# Patient Record
Sex: Female | Born: 2000 | Race: White | Hispanic: Yes | Marital: Single | Smoking: Never smoker
Health system: Southern US, Community
[De-identification: ages and names within clinical notes are randomized; demographics above are authoritative.]

## PROBLEM LIST (undated history)

## (undated) DIAGNOSIS — R011 Cardiac murmur, unspecified: Secondary | ICD-10-CM

## (undated) DIAGNOSIS — R7303 Prediabetes: Secondary | ICD-10-CM

## (undated) DIAGNOSIS — G43109 Migraine with aura, not intractable, without status migrainosus: Secondary | ICD-10-CM

## (undated) HISTORY — PX: MOUTH SURGERY: SHX715

## (undated) HISTORY — DX: Migraine with aura, not intractable, without status migrainosus: G43.109

---
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
## 2019-06-10 NOTE — Progress Notes (Signed)
Kim Gauss, MD   Chief Complaint  Patient presents with  . Metrorrhagia    3 periods in Nov, last one 11/18 and still on it, left side pelvic pain felt this morning, stopped BC in April    HPI:      Kim Trevino is a 18 y.o. G0P0000 who LMP was Patient's last menstrual period was 05/22/2019 (exact date)., presents today for irregular bleeding since 11/20. Menses are usually monthly, last 7-10 days, no BTB, mild dysmen, improved with NSAIDs. Started slynd for [facility] 2/20 and took for 2 months but stopped due to depression and wt gain sx. Feels better off POPs. Hx of migraines with aura. Menses resumed to monthly and had normal menses 10/20. Then had mid cycle spotting 11/20 and normal menses starting 05/22/19, but it hasn't stopped since. Flow is tapering down. Noted sharp, intermittent LLQ pain this AM. Pt denies vag and urin sx, no LBP, fevers.  She is sex active, using condoms every time. No UPT done.  No hx of ovar cyst. FH ovar cancer.   There are no active problems to display for this patient.   History reviewed. No pertinent surgical history.  Family History  Problem Relation Age of Onset  . Ovarian cancer Maternal Grandmother 88       tested, benign    Social History   Socioeconomic History  . Marital status: Single    Spouse name: Not on file  . Number of children: Not on file  . Years of education: Not on file  . Highest education level: Not on file  Occupational History  . Not on file  Social Needs  . Financial resource strain: Not on file  . Food insecurity    Worry: Not on file    Inability: Not on file  . Transportation needs    Medical: Not on file    Non-medical: Not on file  Tobacco Use  . Smoking status: Never Smoker  . Smokeless tobacco: Never Used  Substance and Sexual Activity  . Alcohol use: No  . Drug use: Never  . Sexual activity: Yes    Birth control/protection: None  Lifestyle  . Physical activity    Days per week: Not on  file    Minutes per session: Not on file  . Stress: Not on file  Relationships  . Social Herbalist on phone: Not on file    Gets together: Not on file    Attends religious service: Not on file    Active member of club or organization: Not on file    Attends meetings of clubs or organizations: Not on file    Relationship status: Not on file  . Intimate partner violence    Fear of current or ex partner: Not on file    Emotionally abused: Not on file    Physically abused: Not on file    Forced sexual activity: Not on file  Other Topics Concern  . Not on file  Social History Narrative  . Not on file    Outpatient Medications Prior to Visit  Medication Sig Dispense Refill  . Drospirenone (SLYND) 4 MG TABS Take 1 tablet by mouth daily. (Patient not taking: Reported on 06/11/2019) 84 tablet 3   No facility-administered medications prior to visit.       ROS:  Review of Systems  Constitutional: Negative for fatigue, fever and unexpected weight change.  Respiratory: Negative for cough, shortness of breath and  wheezing.   Cardiovascular: Negative for chest pain, palpitations and leg swelling.  Gastrointestinal: Negative for blood in stool, constipation, diarrhea, nausea and vomiting.  Endocrine: Negative for cold intolerance, heat intolerance and polyuria.  Genitourinary: Positive for dyspareunia and menstrual problem. Negative for dysuria, flank pain, frequency, genital sores, hematuria, pelvic pain, urgency, vaginal bleeding, vaginal discharge and vaginal pain.  Musculoskeletal: Negative for back pain, joint swelling and myalgias.  Skin: Negative for rash.  Neurological: Negative for dizziness, syncope, light-headedness, numbness and headaches.  Hematological: Negative for adenopathy.  Psychiatric/Behavioral: Negative for agitation, confusion, sleep disturbance and suicidal ideas. The patient is not nervous/anxious.    BREAST: No symptoms   OBJECTIVE:   Vitals:   BP 100/78   Ht 5\' 3"  (1.6 m)   Wt 123 lb (55.8 kg)   LMP 05/22/2019 (Exact Date)   BMI 21.79 kg/m   Physical Exam Vitals signs reviewed.  Constitutional:      Appearance: She is well-developed.  Neck:     Musculoskeletal: Normal range of motion.  Pulmonary:     Effort: Pulmonary effort is normal.  Genitourinary:    General: Normal vulva.     Pubic Area: No rash.      Labia:        Right: No rash, tenderness or lesion.        Left: No rash, tenderness or lesion.      Vagina: Normal. No vaginal discharge, erythema, tenderness or bleeding.     Cervix: Normal.     Uterus: Normal. Not enlarged and not tender.      Adnexa: Right adnexa normal.       Right: No mass or tenderness.         Left: Tenderness present. No mass.    Musculoskeletal: Normal range of motion.  Skin:    General: Skin is warm and dry.  Neurological:     General: No focal deficit present.     Mental Status: She is alert and oriented to person, place, and time.  Psychiatric:        Mood and Affect: Mood normal.        Behavior: Behavior normal.        Thought Content: Thought content normal.        Judgment: Judgment normal.     Results: Results for orders placed or performed in visit on 06/11/19 (from the past 24 hour(s))  POCT urine pregnancy     Status: Normal   Collection Time: 06/11/19 10:57 AM  Result Value Ref Range   Preg Test, Ur Negative Negative     Assessment/Plan: Abnormal uterine bleeding (AUB) - Plan: CH STD, POCT urine pregnancy; For 1 cylce. Neg UPT. Check STDs. If neg, see if sx resolve. Most likely due to stress. If bleeding persists (tapering off), will try prog. If AUB continues next month, will check labs and GYN u/s. F/u prn.   Screening for STD (sexually transmitted disease) - Plan: CH STD  LLQ pain--for 1 day, slightly tender on exam. Check STD. If neg and sx persist, will check GYN u/s.      Return if symptoms worsen or fail to improve.  Alicia B. Copland, PA-C  06/11/2019 11:09 AM

## 2019-06-11 ENCOUNTER — Ambulatory Visit (INDEPENDENT_AMBULATORY_CARE_PROVIDER_SITE_OTHER): Payer: Medicaid Other | Admitting: Obstetrics and Gynecology

## 2019-06-11 ENCOUNTER — Other Ambulatory Visit: Payer: Self-pay

## 2019-06-11 ENCOUNTER — Other Ambulatory Visit (HOSPITAL_COMMUNITY)
Admission: RE | Admit: 2019-06-11 | Discharge: 2019-06-11 | Disposition: A | Discharge status: Home / Self Care | Payer: Medicaid Other | Source: Ambulatory Visit | Attending: Obstetrics and Gynecology | Admitting: Obstetrics and Gynecology

## 2019-06-11 ENCOUNTER — Encounter: Payer: Self-pay | Admitting: Obstetrics and Gynecology

## 2019-06-11 VITALS — BP 100/78 | Ht 63.0 in | Wt 123.0 lb

## 2019-06-11 DIAGNOSIS — N926 Irregular menstruation, unspecified: Secondary | ICD-10-CM | POA: Insufficient documentation

## 2019-06-11 DIAGNOSIS — Z3202 Encounter for pregnancy test, result negative: Secondary | ICD-10-CM

## 2019-06-11 DIAGNOSIS — R1032 Left lower quadrant pain: Secondary | ICD-10-CM | POA: Diagnosis not present

## 2019-06-11 DIAGNOSIS — Z113 Encounter for screening for infections with a predominantly sexual mode of transmission: Secondary | ICD-10-CM | POA: Insufficient documentation

## 2019-06-11 DIAGNOSIS — N939 Abnormal uterine and vaginal bleeding, unspecified: Secondary | ICD-10-CM | POA: Diagnosis not present

## 2019-06-11 LAB — POCT URINE PREGNANCY: Preg Test, Ur: NEGATIVE

## 2019-06-11 NOTE — Patient Instructions (Signed)
I value your feedback and entrusting us with your care. If you get a [facility] patient survey, I would appreciate you taking the time to let us know about your experience today. Thank you! 

## 2019-06-12 ENCOUNTER — Encounter: Payer: Self-pay | Admitting: Obstetrics and Gynecology

## 2019-06-12 LAB — CERVICOVAGINAL ANCILLARY ONLY
Chlamydia: NEGATIVE
Comment: NEGATIVE
Comment: NORMAL
Neisseria Gonorrhea: NEGATIVE

## 2019-07-28 IMAGING — CT CT ABD-PELV W/ CM
2 of 4 series · 16 of 46 positions shown, 18 images · IV contrast (APPLIED)
Comparison: None.

CLINICAL DATA: Right flank pain

EXAM:
CT ABDOMEN AND PELVIS WITH CONTRAST
TECHNIQUE: Multidetector CT imaging of the abdomen and pelvis was performed
using the standard protocol following bolus administration of
intravenous contrast.
CONTRAST:  75mL OMNIPAQUE IOHEXOL 300 MG/ML  SOLN

[Series 2: routine abd/pel with · axial · 0.66mm/px · z∈[-462,-57]mm · 13 of 89 slices shown, 15 images]
[im 4/89  soft-tissue]
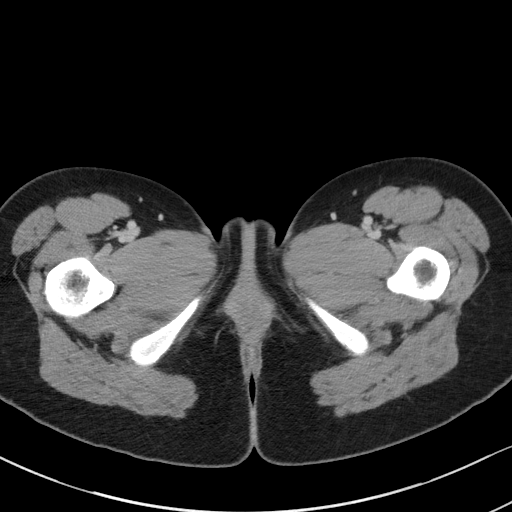
[im 4/89  bone]
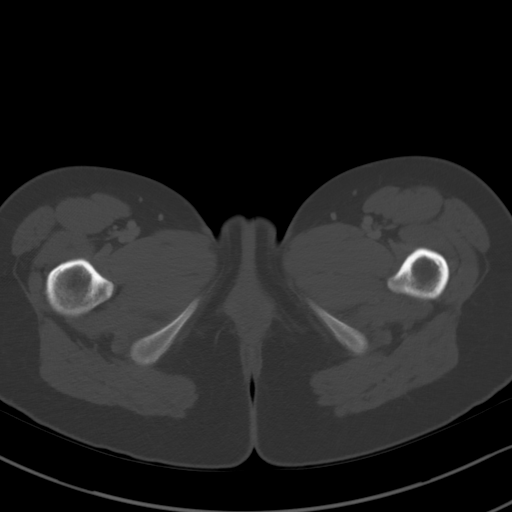
[im 11/89  soft-tissue]
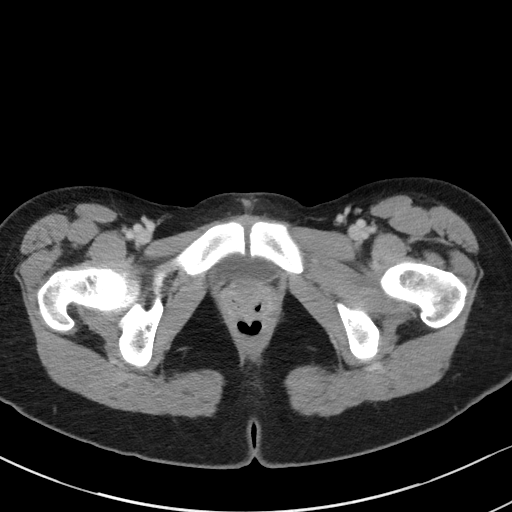
[im 18/89  soft-tissue]
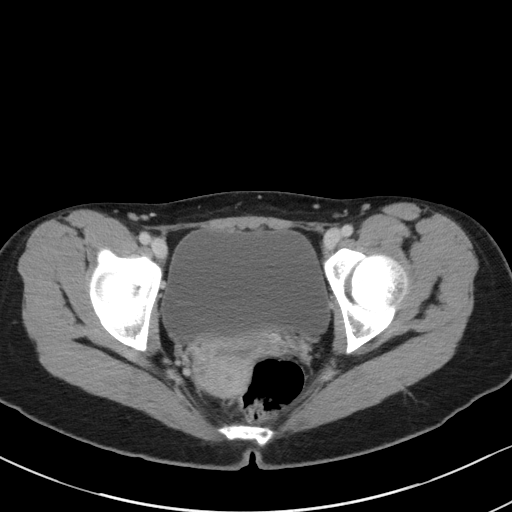
[im 25/89  soft-tissue]
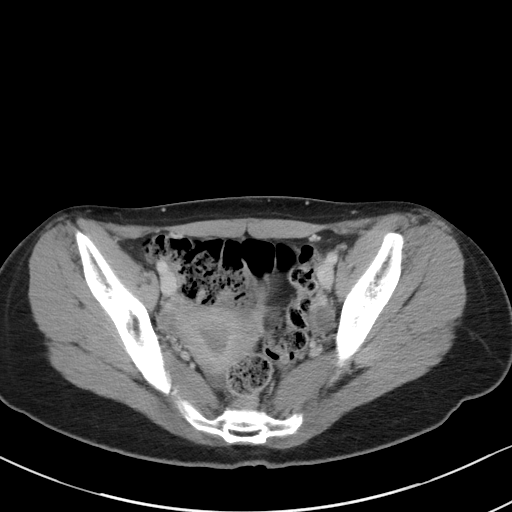
[im 32/89  soft-tissue]
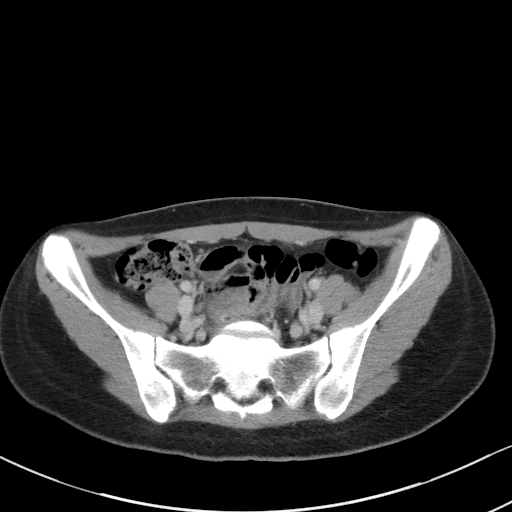
[im 39/89  soft-tissue]
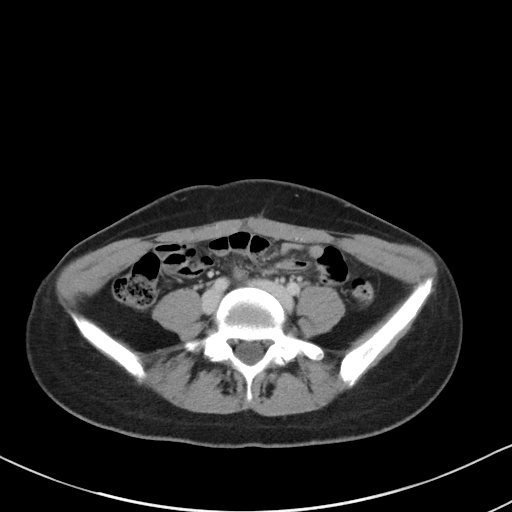
[im 46/89  soft-tissue]
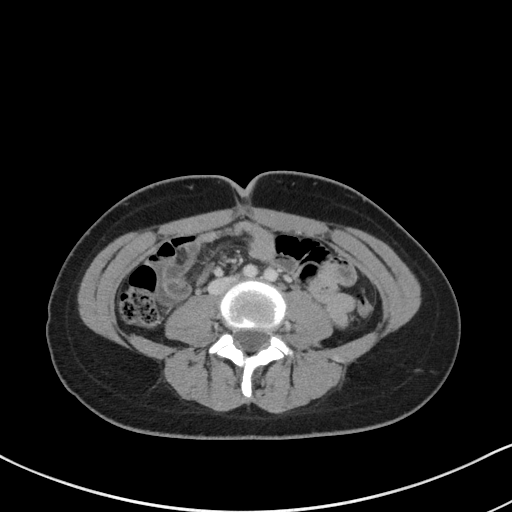
[im 50/89  soft-tissue]
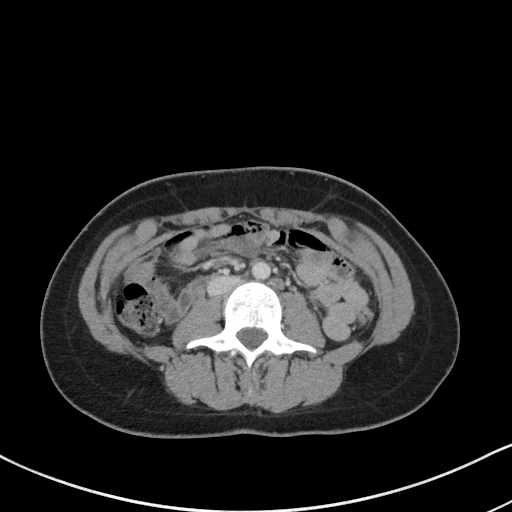
[im 57/89  soft-tissue]
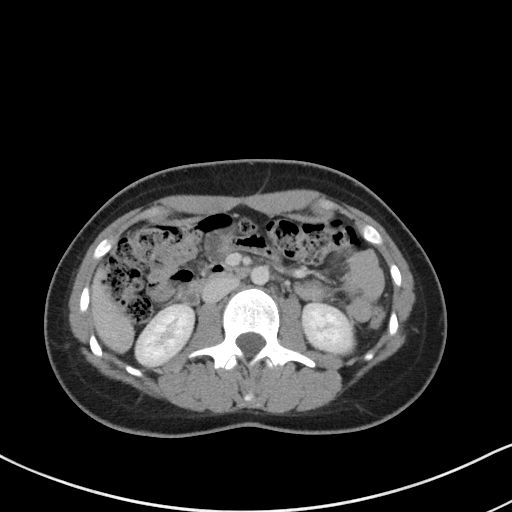
[im 57/89  bone]
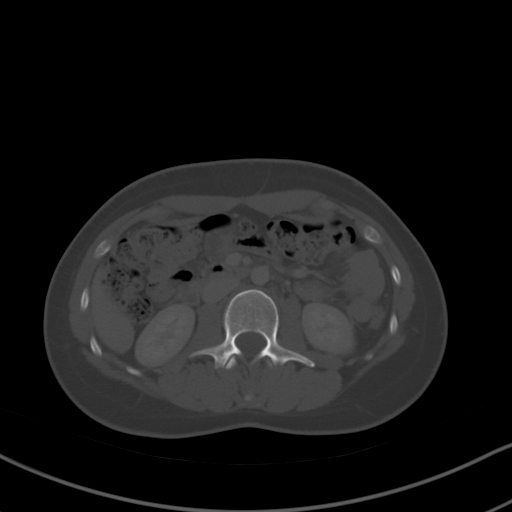
[im 64/89  soft-tissue]
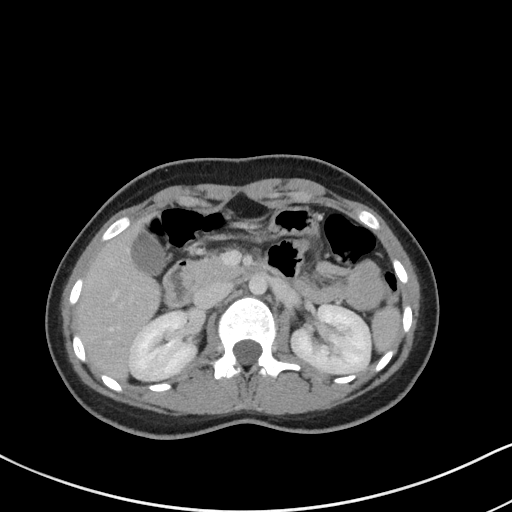
[im 71/89  soft-tissue]
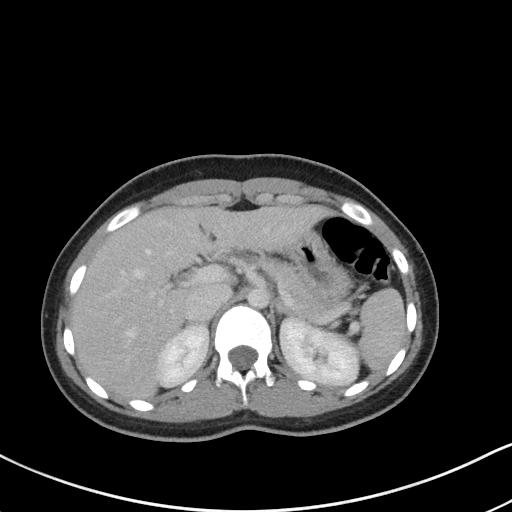
[im 78/89  soft-tissue]
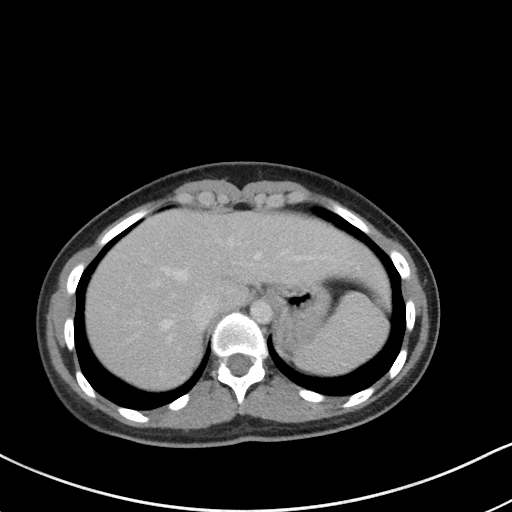
[im 85/89  soft-tissue]
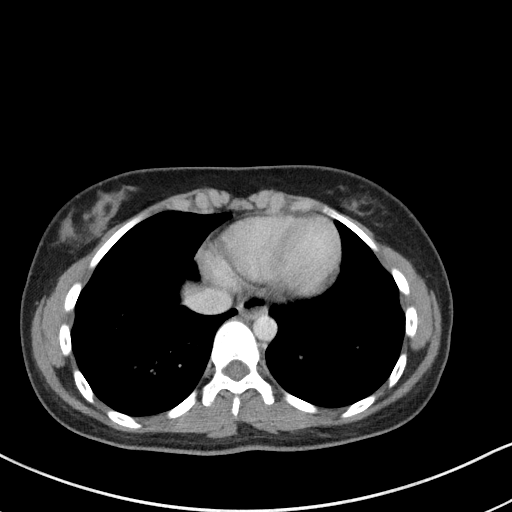

[Series 5: coronal st · coronal · 0.62mm/px · 3 of 69 slices shown]
[im 23/69  soft-tissue]
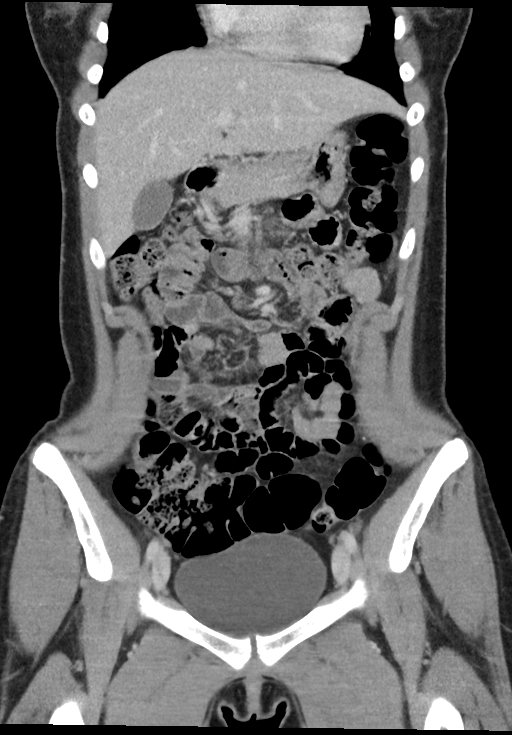
[im 31/69  soft-tissue]
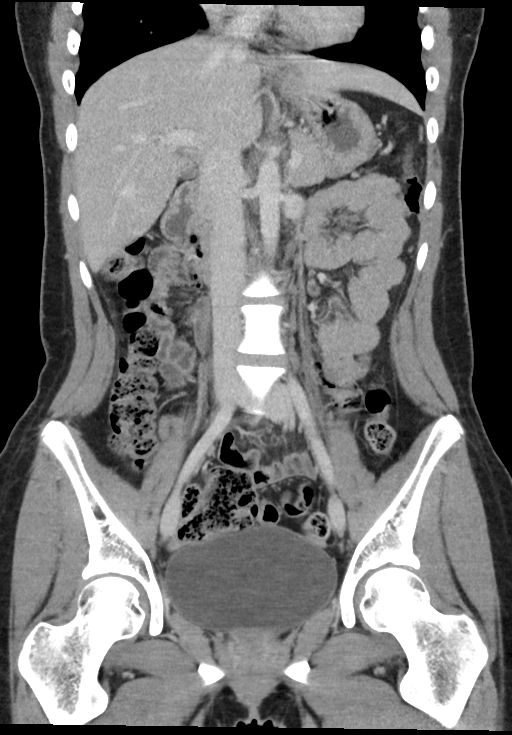
[im 38/69  soft-tissue]
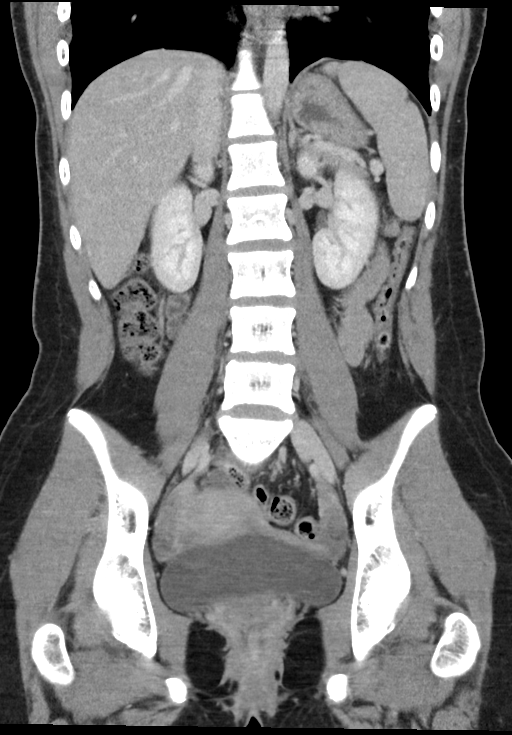

[16 of 46 positions shown; findings below may reference images not displayed]

FINDINGS: Lower chest: No acute abnormality.

Hepatobiliary: No focal liver abnormality is seen. No gallstones,
gallbladder wall thickening, or biliary dilatation.

Pancreas: Unremarkable. No pancreatic ductal dilatation or
surrounding inflammatory changes.

Spleen: Normal in size without focal abnormality.

Adrenals/Urinary Tract: Adrenal glands are unremarkable. Kidneys are
normal, without renal calculi, focal lesion, or hydronephrosis.
Bladder is unremarkable.

Stomach/Bowel: There is a small hiatal hernia. The stomach is
otherwise normal. The appendix is not seen but no inflammation is
noted around cecum. No evidence of bowel wall thickening,
distention, or inflammatory changes.

Vascular/Lymphatic: No significant vascular findings are present. No
enlarged abdominal or pelvic lymph nodes.

Reproductive: Uterus and bilateral adnexa are unremarkable.

Other: Minimal free fluid is identified in the pelvis, likely
physiologic.

Musculoskeletal: No acute or significant osseous findings.
IMPRESSION: No acute abnormality identified in the abdomen pelvis. There is no
bowel obstruction.

The appendix is not seen but no inflammation is noted around the
cecum.

Bilateral kidneys are normal.

## 2020-09-01 IMAGING — US US ABDOMEN LIMITED
1 series · 14 of 25 positions shown · non-contrast
Comparison: None.

CLINICAL DATA: Right-sided abdominal pain.

EXAM:
ULTRASOUND ABDOMEN LIMITED
TECHNIQUE: Gray scale imaging of the right lower quadrant was performed to
evaluate for suspected appendicitis. Standard imaging planes and
graded compression technique were utilized.

[Series 1: us abdomen limited · 0.13mm/px · 14 of 29 slices shown]
[im 1/29]
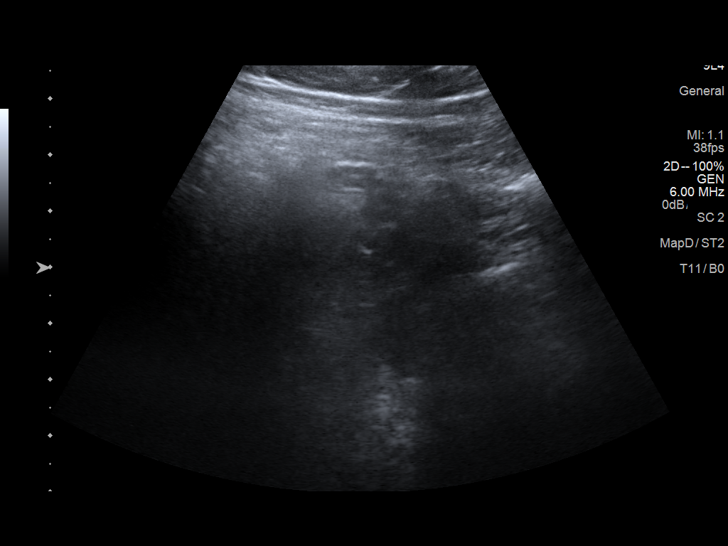
[im 3/29]
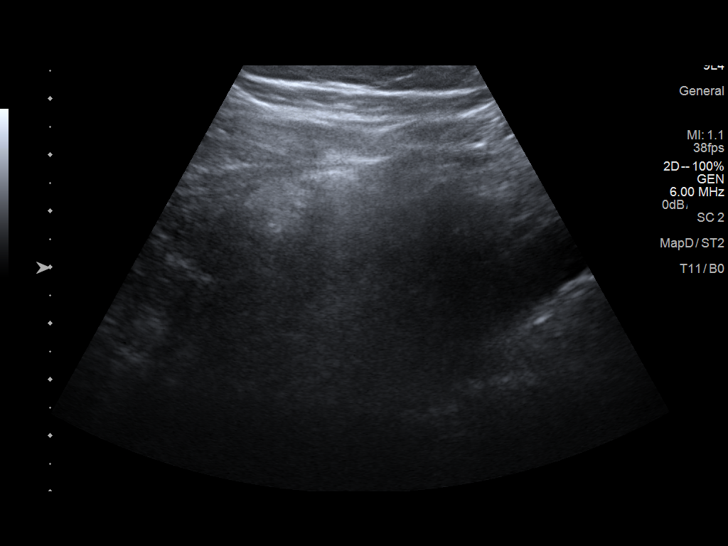
[im 5/29]
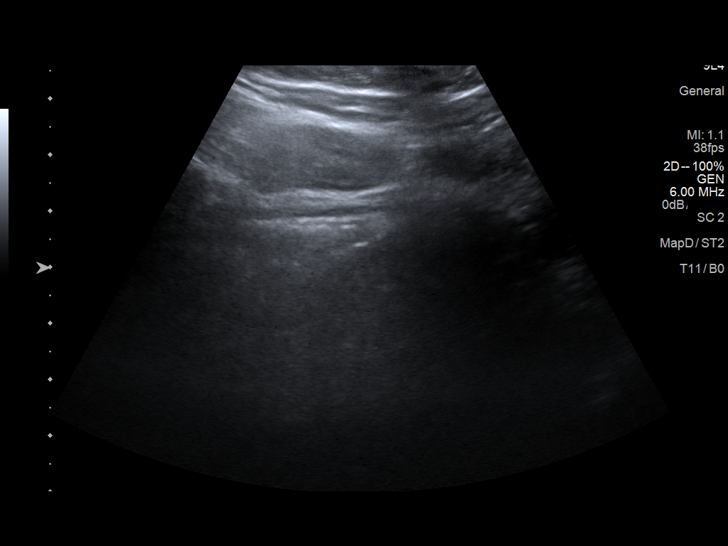
[im 8/29]
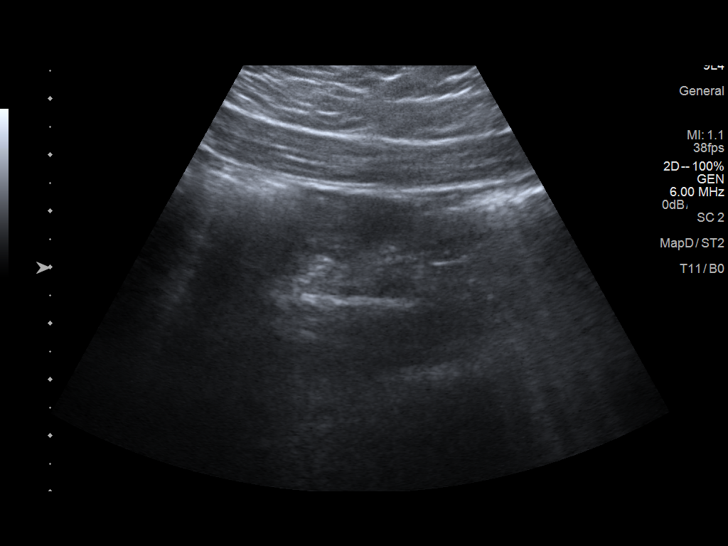
[im 10/29]
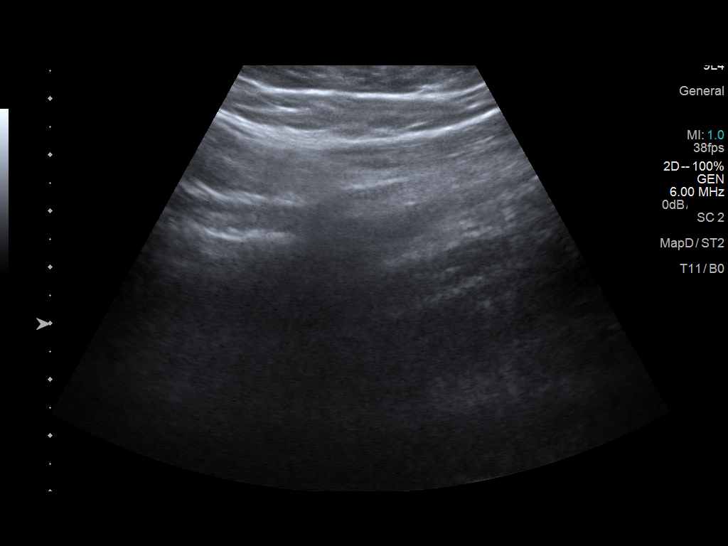
[im 11/29]
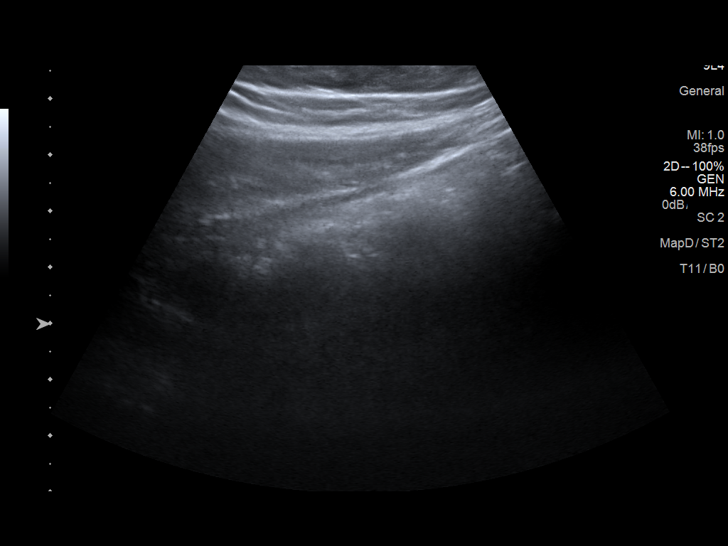
[im 13/29]
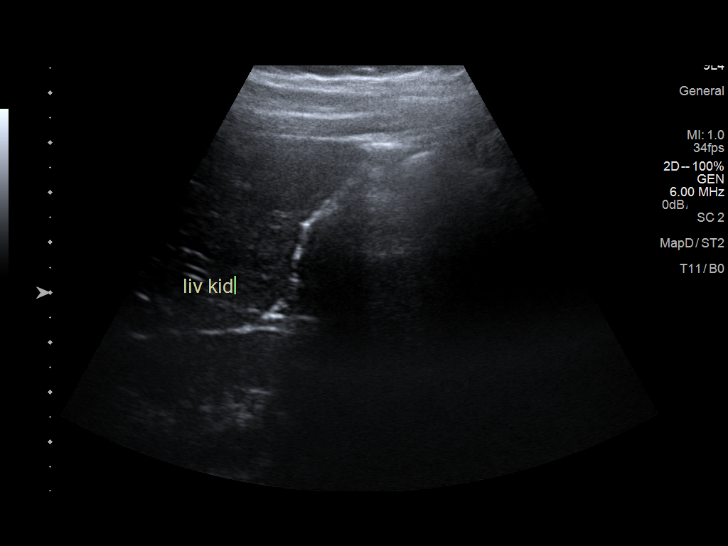
[im 16/29]
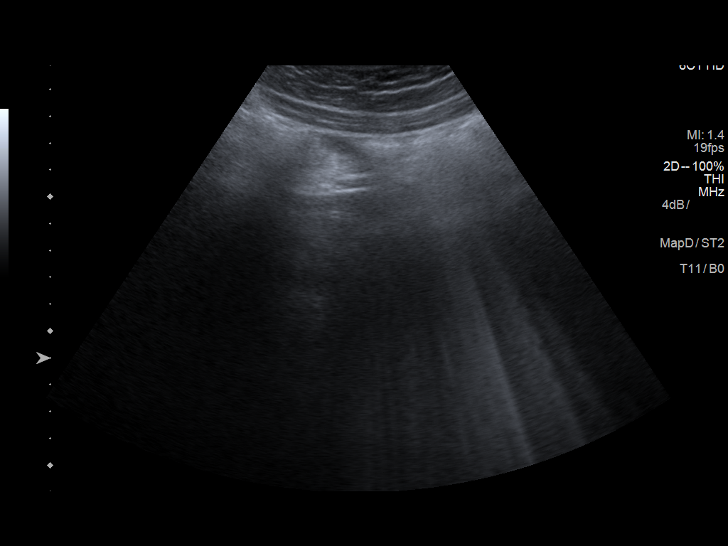
[im 18/29]
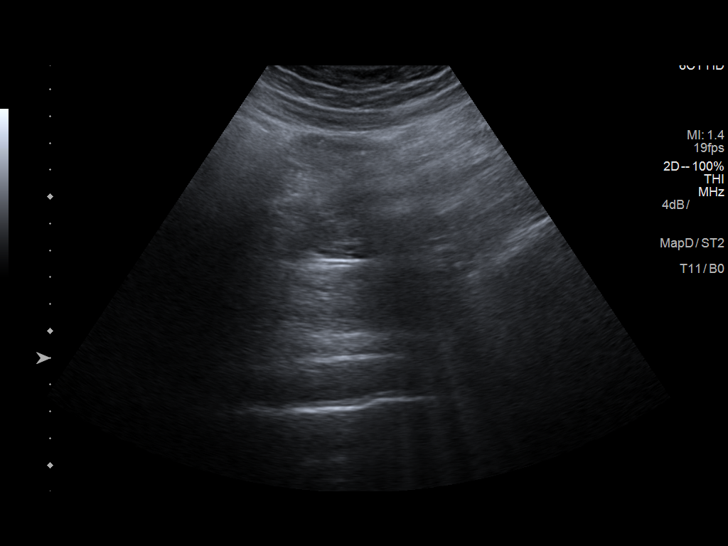
[im 19/29]
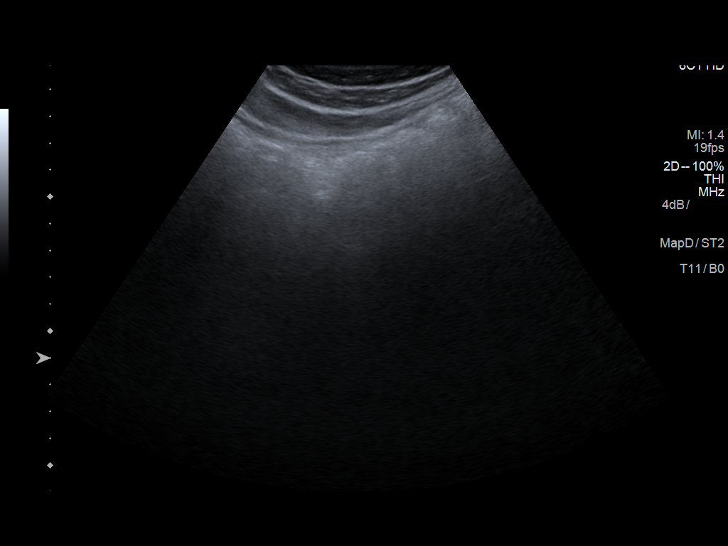
[im 22/29]
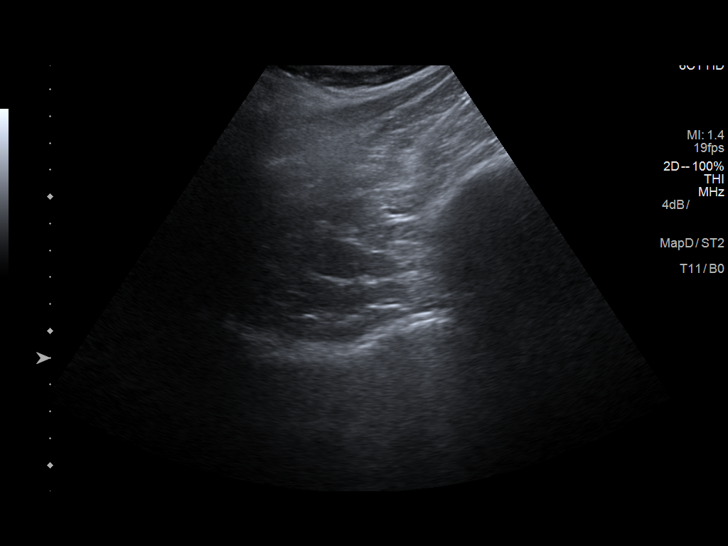
[im 24/29]
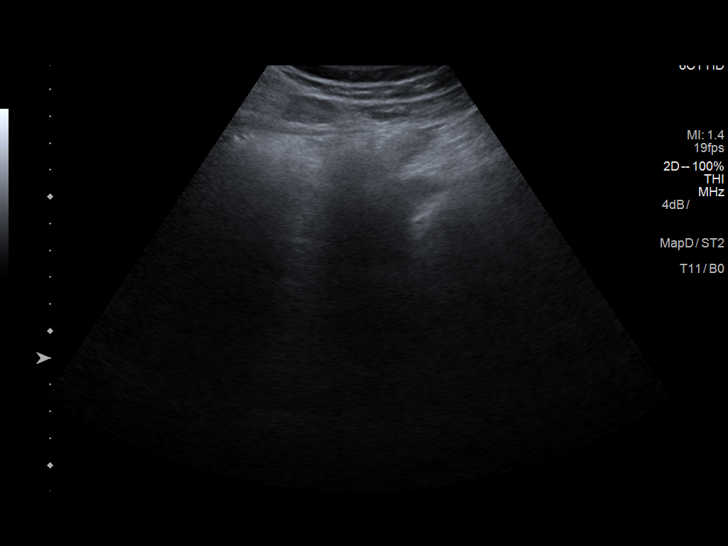
[im 26/29]
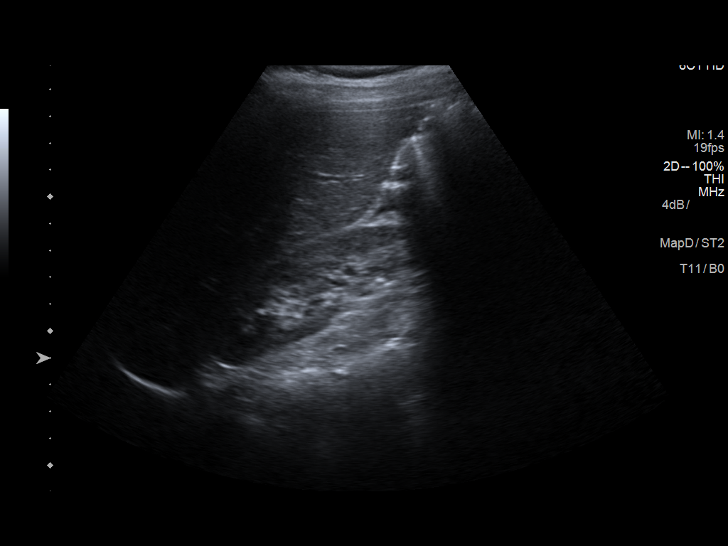
[im 29/29]
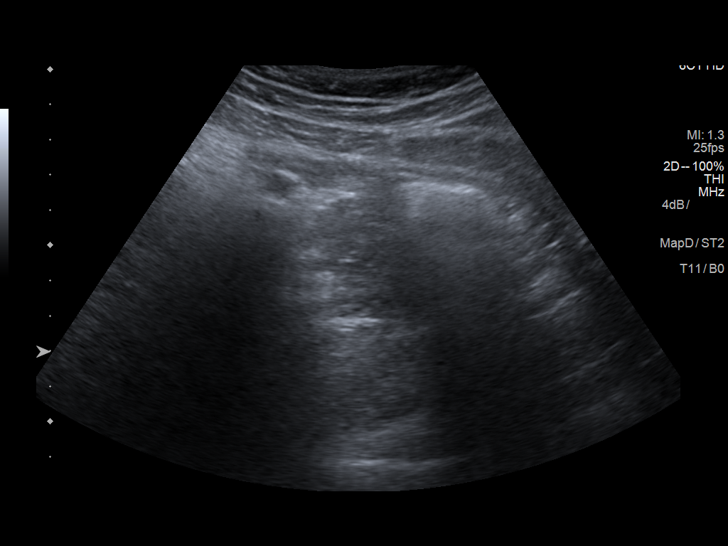

[14 of 25 positions shown; findings below may reference images not displayed]

FINDINGS: The appendix is not visualized.

Ancillary findings: None.

Factors affecting image quality: None.
IMPRESSION: The appendix is not visualized.

Note: Non-visualization of appendix by US does not definitely
exclude appendicitis. If there is sufficient clinical concern,
consider abdomen pelvis CT with contrast for further evaluation.

## 2021-09-06 NOTE — H&P (Signed)
Preoperative History and Physical ? ?Kim Trevino is a 21 y.o. G0P0000 here for surgical management of menorrhagia with irregular cycle, endometrial polyp.   No significant preoperative concerns. ? ?History of Present Illness: 21 y.o. G0P0000 female who presents with a concern for passing a large clot with her period. The clot was the size of a golf ball approximately. She had extreme pain, too. She normally has heavy, prolonged periods. But the pain was worse this time.  She normally has a period monthly that she tracks with an app.  The periods come monthly. She also has random spotting throughout the month.  She sometimes has spotting with working out. She has pretty bad pain during her period.   ?The most recent period she had back pain.  Normally, she had pain in her lower back and legs.   ?Her periods are normally heavy.  She had to leave the gym due to her tampon being soaked.   ?Normally, she has to change a pad or tampon every 2 hours. This goes on for 4-5 days.  She has been trying to use a menstrual cup.   ?Her mother had endometriosis at a young age.  She has an older sister who is 33 years older than her.  She is anemic, she is unsure why. She doesn't think it's due to her periods and doesn't seem to have endometriosis. Her sister has conceived twice.   ?  ?She has never had an STD. She hasn't been screened in a while.  She thinks she has never had a pap smear.   ?She has never been hospitalized for heavy bleeding. She has never had a blood transfusion. She does not have epistaxis or bleeding in her gums when she brushes her tooth.    ? ?Pelvic ultrasound on 08/25/2021: ?Ultrasound demonstrates the following findings ?Adnexa: small right ovarian cyst vs follicle. ?Uterus: anteverted with endometrial stripe  14.9 mm ?Additional: Endometrial polyp, measuring 2.48 x 1.2 x 2.35 cm ?  ?She continues with the same symptoms. ?Pap and STI screen normal. ? ?Proposed surgery: Hysteroscopy, dilation and  curettage, endometrial polypectomy ? ?Past Medical History:  ?Diagnosis Date  ? Migraine with aura   ? ?No past surgical history on file. ?OB History  ?Gravida Para Term Preterm AB Living  ?0 0 0 0 0 0  ?SAB IAB Ectopic Multiple Live Births  ?0 0 0 0 0  ?Patient denies any other pertinent gynecologic issues.  ? ?No current facility-administered medications on file prior to encounter.  ? ?Current Outpatient Medications on File Prior to Encounter  ?Medication Sig Dispense Refill  ? metroNIDAZOLE (FLAGYL) 500 MG tablet Take 500 mg by mouth 2 (two) times daily.    ? Probiotic Product (PROBIOTIC DAILY PO) Take 1 tablet by mouth daily.    ? ?Allergies  ?Allergen Reactions  ? Kiwi Extract Other (See Comments)  ?  Scratching throat ?Itching Mouth  ? Tetracycline Rash  ? ? ?Social History:   reports that she has never smoked. She has never used smokeless tobacco. She reports that she does not drink alcohol and does not use drugs. ? ?Family History  ?Problem Relation Age of Onset  ? Ovarian cancer Maternal Grandmother 88  ?     tested, benign  ? ? ?Review of Systems: Noncontributory ? ?PHYSICAL EXAM: ?There were no vitals taken for this visit. ?CONSTITUTIONAL: Well-developed, well-nourished female in no acute distress.  ?HENT:  Normocephalic, atraumatic, External right and left ear normal. Oropharynx is clear  and moist ?EYES: Conjunctivae and EOM are normal. Pupils are equal, round, and reactive to light. No scleral icterus.  ?NECK: Normal range of motion, supple, no masses ?SKIN: Skin is warm and dry. No rash noted. Not diaphoretic. No erythema. No pallor. ?[place]: Alert and oriented to person, place, and time. Normal reflexes, muscle tone coordination. No cranial nerve deficit noted. ?PSYCHIATRIC: Normal mood and affect. Normal behavior. Normal judgment and thought content. ?CARDIOVASCULAR: Normal heart rate noted, regular rhythm ?RESPIRATORY: Effort and breath sounds normal, no problems with respiration noted ?ABDOMEN:  Soft, nontender, nondistended. ?PELVIC: Deferred ?MUSCULOSKELETAL: Normal range of motion. No edema and no tenderness. 2+ distal pulses. ? ?Labs: ?No results found for this or any previous visit (from the past 336 hour(s)). ? ?Imaging Studies: ?No results found. ? ?Assessment: ?Menorrhagia with irregular cycle ?Endometrial polyp ?Dysmenorrhea ? ? ?Plan: ?Patient will undergo surgical management with the above proposed surgery.   The risks of surgery were discussed in detail with the patient including but not limited to: bleeding which may require transfusion or reoperation; infection which may require antibiotics; injury to surrounding organs which may involve bowel, bladder, ureters ; need for additional procedures including laparoscopy or laparotomy; thromboembolic phenomenon, surgical site problems and other postoperative/anesthesia complications. Likelihood of success in alleviating the patient's condition was discussed. Routine postoperative instructions will be reviewed with the patient and her family in detail after surgery.  The patient concurred with the proposed plan, giving informed written consent for the surgery.  Preoperative prophylactic antibiotics, as indicated, and SCDs ordered on call to the OR.   ? ?Prentice Docker, MD ?09/06/2021 2:38 PM    ?

## 2021-09-09 ENCOUNTER — Encounter
Admission: RE | Admit: 2021-09-09 | Discharge: 2021-09-09 | Disposition: A | Discharge status: Home / Self Care | Payer: Medicaid Other | Source: Ambulatory Visit | Attending: Obstetrics and Gynecology | Admitting: Obstetrics and Gynecology

## 2021-09-09 ENCOUNTER — Other Ambulatory Visit: Payer: Self-pay

## 2021-09-09 HISTORY — DX: Cardiac murmur, unspecified: R01.1

## 2021-09-09 HISTORY — DX: Prediabetes: R73.03

## 2021-09-09 NOTE — Patient Instructions (Addendum)
Your procedure is scheduled on:09-16-21 Thursday ?Report to the Registration Desk on the 1st floor of the Medical Mall.Then proceed to the 2nd floor Surgery Desk in the Medical mall ?To find out your arrival time, please call [phone] between 1PM - 3PM on:09-15-21 Wednesday ? ?REMEMBER: ?Instructions that are not followed completely may result in serious medical risk, up to and including death; or upon the discretion of your surgeon and anesthesiologist your surgery may need to be rescheduled. ? ?Do not eat food after midnight the night before surgery.  ?No gum chewing, lozengers or hard candies. ? ?You may however, drink CLEAR liquids up to 2 hours before you are scheduled to arrive for your surgery. Do not drink anything within 2 hours of your scheduled arrival time. ? ?Clear liquids include: ?- water  ?- apple juice without pulp ?- gatorade (not RED colors) ?- black coffee or tea (Do NOT add milk or creamers to the coffee or tea) ?Do NOT drink anything that is not on this list ? ?In addition, your doctor has ordered for you to drink the provided  ?Ensure Pre-Surgery Clear Carbohydrate Drink ?Drinking this carbohydrate drink up to two hours before surgery helps to reduce insulin resistance and improve patient outcomes. Please complete drinking 2 hours prior to scheduled arrival time. ? ?Do NOT take any medication the day of surgery ? ?One week prior to surgery: ?Stop Anti-inflammatories (NSAIDS) such as Advil, Aleve, Ibuprofen, Motrin, Naproxen, Naprosyn and Aspirin based products such as Excedrin, Goodys Powder, BC Powder.You may however, take Tylenol if needed for pain up until the day of surgery. ? ?Stop ANY OVER THE COUNTER supplements/vitamins NOW (09-09-21) until after surgery (Probiotic and Whey Protein Powder) ? ?No Alcohol for 24 hours before or after surgery. ? ?No Smoking including e-cigarettes for 24 hours prior to surgery.  ?No chewable tobacco products for at least 6 hours prior to surgery.  ?No  nicotine patches on the day of surgery. ? ?Do not use any "recreational" drugs for at least a week prior to your surgery.  ?Please be advised that the combination of cocaine and anesthesia may have negative outcomes, up to and including death. ?If you test positive for cocaine, your surgery will be cancelled. ? ?On the morning of surgery brush your teeth with toothpaste and water, you may rinse your mouth with mouthwash if you wish. ?Do not swallow any toothpaste or mouthwash. ? ?Do not wear jewelry, make-up, hairpins, clips or nail polish. ? ?Do not wear lotions, powders, or perfumes.  ? ?Do not shave body from the neck down 48 hours prior to surgery just in case you cut yourself which could leave a site for infection.  ?Also, freshly shaved skin may become irritated if using the CHG soap. ? ?Contact lenses, hearing aids and dentures may not be worn into surgery. ? ?Do not bring valuables to the hospital. [facility] is not responsible for any missing/lost belongings or valuables.  ? ?Notify your doctor if there is any change in your medical condition (cold, fever, infection). ? ?Wear comfortable clothing (specific to your surgery type) to the hospital. ? ?After surgery, you can help prevent lung complications by doing breathing exercises.  ?Take deep breaths and cough every 1-2 hours. Your doctor may order a device called an Incentive Spirometer to help you take deep breaths. ?When coughing or sneezing, hold a pillow firmly against your incision with both hands. This is called ?splinting.? Doing this helps protect your incision. It also decreases belly  discomfort. ? ?If you are being admitted to the hospital overnight, leave your suitcase in the car. ?After surgery it may be brought to your room. ? ?If you are being discharged the day of surgery, you will not be allowed to drive home. ?You will need a responsible adult (18 years or older) to drive you home and stay with you that night.  ? ?If you are taking public  transportation, you will need to have a responsible adult (18 years or older) with you. ?Please confirm with your physician that it is acceptable to use public transportation.  ? ?Please call the Pre-admissions Testing Dept. at [phone] if you have any questions about these instructions. ? ?Surgery Visitation Policy: ? ?Patients undergoing a surgery or procedure may have one family member or support person with them as long as that person is not COVID-19 positive or experiencing its symptoms.  ?That person may remain in the waiting area during the procedure and may rotate out with other people. ?

## 2021-09-15 MED ORDER — LACTATED RINGERS IV SOLN: INTRAVENOUS | Status: DC

## 2021-09-15 MED ORDER — FAMOTIDINE 20 MG PO TABS
20.0000 mg | ORAL_TABLET | Freq: Once | ORAL | Status: AC
Start: 2021-09-15 — End: 2021-09-16

## 2021-09-15 MED ORDER — CHLORHEXIDINE GLUCONATE 0.12 % MT SOLN: 15.0000 mL | Freq: Once | OROMUCOSAL | Status: AC

## 2021-09-15 MED ORDER — ORAL CARE MOUTH RINSE: 15.0000 mL | Freq: Once | OROMUCOSAL | Status: AC

## 2021-09-16 ENCOUNTER — Encounter
Admission: RE | Disposition: A | Discharge status: Home / Self Care | Payer: Self-pay | Source: Home / Self Care | Attending: Obstetrics and Gynecology

## 2021-09-16 ENCOUNTER — Other Ambulatory Visit: Payer: Self-pay

## 2021-09-16 ENCOUNTER — Encounter: Payer: Self-pay | Admitting: Obstetrics and Gynecology

## 2021-09-16 ENCOUNTER — Ambulatory Visit: Payer: Medicaid Other | Admitting: Anesthesiology

## 2021-09-16 ENCOUNTER — Ambulatory Visit
Admission: RE | Admit: 2021-09-16 | Discharge: 2021-09-16 | Disposition: A | Discharge status: Home / Self Care | Payer: Medicaid Other | Attending: Obstetrics and Gynecology | Admitting: Obstetrics and Gynecology

## 2021-09-16 DIAGNOSIS — N840 Polyp of corpus uteri: Secondary | ICD-10-CM | POA: Diagnosis present

## 2021-09-16 DIAGNOSIS — R7303 Prediabetes: Secondary | ICD-10-CM | POA: Insufficient documentation

## 2021-09-16 DIAGNOSIS — N921 Excessive and frequent menstruation with irregular cycle: Secondary | ICD-10-CM | POA: Diagnosis present

## 2021-09-16 HISTORY — PX: HYSTEROSCOPY WITH D & C: SHX1775

## 2021-09-16 LAB — POCT PREGNANCY, URINE: Preg Test, Ur: NEGATIVE

## 2021-09-16 SURGERY — DILATATION AND CURETTAGE /HYSTEROSCOPY
Anesthesia: General | Site: Uterus

## 2021-09-16 MED ORDER — DEXAMETHASONE SODIUM PHOSPHATE 10 MG/ML IJ SOLN
INTRAMUSCULAR | Status: DC | PRN
  Administered 2021-09-16: 10 mg via INTRAVENOUS

## 2021-09-16 MED ORDER — SODIUM CHLORIDE 0.9 % IR SOLN
Status: DC | PRN
  Administered 2021-09-16: 6000 mL

## 2021-09-16 MED ORDER — CHLORHEXIDINE GLUCONATE 0.12 % MT SOLN
OROMUCOSAL | Status: AC
  Administered 2021-09-16: 15 mL via OROMUCOSAL
  Filled 2021-09-16: qty 15

## 2021-09-16 MED ORDER — MIDAZOLAM HCL 2 MG/2ML IJ SOLN
INTRAMUSCULAR | Status: AC
  Filled 2021-09-16: qty 2

## 2021-09-16 MED ORDER — MIDAZOLAM HCL 2 MG/2ML IJ SOLN
INTRAMUSCULAR | Status: DC | PRN
  Administered 2021-09-16 (×2): 1 mg via INTRAVENOUS

## 2021-09-16 MED ORDER — IBUPROFEN 600 MG PO TABS: 600.0000 mg | ORAL_TABLET | Freq: Four times a day (QID) | ORAL | 0 refills | Status: AC

## 2021-09-16 MED ORDER — LIDOCAINE HCL (CARDIAC) PF 100 MG/5ML IV SOSY
PREFILLED_SYRINGE | INTRAVENOUS | Status: DC | PRN
  Administered 2021-09-16: 100 mg via INTRAVENOUS

## 2021-09-16 MED ORDER — ONDANSETRON HCL 4 MG/2ML IJ SOLN: 4.0000 mg | Freq: Once | INTRAMUSCULAR | Status: DC | PRN

## 2021-09-16 MED ORDER — FENTANYL CITRATE (PF) 100 MCG/2ML IJ SOLN
INTRAMUSCULAR | Status: DC | PRN
  Administered 2021-09-16: 50 ug via INTRAVENOUS
  Administered 2021-09-16 (×2): 25 ug via INTRAVENOUS

## 2021-09-16 MED ORDER — SILVER NITRATE-POT NITRATE 75-25 % EX MISC
CUTANEOUS | Status: DC | PRN
  Administered 2021-09-16: 4

## 2021-09-16 MED ORDER — 0.9 % SODIUM CHLORIDE (POUR BTL) OPTIME
TOPICAL | Status: DC | PRN
  Administered 2021-09-16: 500 mL

## 2021-09-16 MED ORDER — OXYCODONE HCL 5 MG PO TABS
ORAL_TABLET | ORAL | Status: AC
  Filled 2021-09-16: qty 1

## 2021-09-16 MED ORDER — ACETAMINOPHEN 500 MG PO TABS
1000.0000 mg | ORAL_TABLET | ORAL | Status: AC
  Administered 2021-09-16: 1000 mg via ORAL

## 2021-09-16 MED ORDER — KETOROLAC TROMETHAMINE 30 MG/ML IJ SOLN
INTRAMUSCULAR | Status: DC | PRN
  Administered 2021-09-16: 30 mg via INTRAVENOUS

## 2021-09-16 MED ORDER — DEXAMETHASONE SODIUM PHOSPHATE 10 MG/ML IJ SOLN
INTRAMUSCULAR | Status: AC
  Filled 2021-09-16: qty 1

## 2021-09-16 MED ORDER — OXYCODONE HCL 5 MG PO TABS
5.0000 mg | ORAL_TABLET | Freq: Once | ORAL | Status: AC | PRN
  Administered 2021-09-16: 5 mg via ORAL

## 2021-09-16 MED ORDER — LIDOCAINE HCL (PF) 2 % IJ SOLN
INTRAMUSCULAR | Status: AC
  Filled 2021-09-16: qty 5

## 2021-09-16 MED ORDER — PROPOFOL 10 MG/ML IV BOLUS
INTRAVENOUS | Status: DC | PRN
  Administered 2021-09-16: 200 mg via INTRAVENOUS

## 2021-09-16 MED ORDER — ONDANSETRON HCL 4 MG/2ML IJ SOLN
INTRAMUSCULAR | Status: AC
  Filled 2021-09-16: qty 2

## 2021-09-16 MED ORDER — FAMOTIDINE 20 MG PO TABS
ORAL_TABLET | ORAL | Status: AC
  Administered 2021-09-16: 20 mg via ORAL
  Filled 2021-09-16: qty 1

## 2021-09-16 MED ORDER — ONDANSETRON HCL 4 MG/2ML IJ SOLN
INTRAMUSCULAR | Status: DC | PRN
Start: 2021-09-16 — End: 2021-09-16
  Administered 2021-09-16: 4 mg via INTRAVENOUS

## 2021-09-16 MED ORDER — ACETAMINOPHEN 500 MG PO TABS
ORAL_TABLET | ORAL | Status: AC
  Filled 2021-09-16: qty 2

## 2021-09-16 MED ORDER — KETOROLAC TROMETHAMINE 30 MG/ML IJ SOLN
INTRAMUSCULAR | Status: AC
  Filled 2021-09-16: qty 1

## 2021-09-16 MED ORDER — FENTANYL CITRATE (PF) 100 MCG/2ML IJ SOLN: 25.0000 ug | INTRAMUSCULAR | Status: DC | PRN

## 2021-09-16 MED ORDER — FENTANYL CITRATE (PF) 100 MCG/2ML IJ SOLN
INTRAMUSCULAR | Status: AC
  Filled 2021-09-16: qty 2

## 2021-09-16 MED ORDER — PROPOFOL 10 MG/ML IV BOLUS
INTRAVENOUS | Status: AC
  Filled 2021-09-16: qty 20

## 2021-09-16 MED ORDER — PHENYLEPHRINE 40 MCG/ML (10ML) SYRINGE FOR IV PUSH (FOR BLOOD PRESSURE SUPPORT)
PREFILLED_SYRINGE | INTRAVENOUS | Status: DC | PRN
  Administered 2021-09-16: 160 ug via INTRAVENOUS
  Administered 2021-09-16: 200 ug via INTRAVENOUS
  Administered 2021-09-16 (×2): 160 ug via INTRAVENOUS
  Administered 2021-09-16 (×2): 80 ug via INTRAVENOUS
  Administered 2021-09-16: 160 ug via INTRAVENOUS
  Administered 2021-09-16: 80 ug via INTRAVENOUS

## 2021-09-16 SURGICAL SUPPLY — 28 items
BACTOSHIELD CHG 4% 4OZ (MISCELLANEOUS) ×1
BAG DRN RND TRDRP ANRFLXCHMBR (UROLOGICAL SUPPLIES)
BAG URINE DRAIN 2000ML AR STRL (UROLOGICAL SUPPLIES) IMPLANT
CATH FOLEY 2WAY  5CC 16FR (CATHETERS)
CATH FOLEY 2WAY 5CC 16FR (CATHETERS)
CATH URTH 16FR FL 2W BLN LF (CATHETERS) IMPLANT
DEVICE MYOSURE LITE (MISCELLANEOUS) ×1 IMPLANT
DEVICE MYOSURE REACH (MISCELLANEOUS) ×2 IMPLANT
DRSG TELFA 3X8 NADH (GAUZE/BANDAGES/DRESSINGS) ×2 IMPLANT
ELECT REM PT RETURN 9FT ADLT (ELECTROSURGICAL) ×2
ELECTRODE REM PT RTRN 9FT ADLT (ELECTROSURGICAL) ×1 IMPLANT
GLOVE SURG ENC MOIS LTX SZ7 (GLOVE) ×2 IMPLANT
GLOVE SURG UNDER POLY LF SZ7.5 (GLOVE) ×2 IMPLANT
GOWN STRL REUS W/ TWL LRG LVL3 (GOWN DISPOSABLE) ×2 IMPLANT
GOWN STRL REUS W/TWL LRG LVL3 (GOWN DISPOSABLE) ×4
IV NS IRRIG 3000ML ARTHROMATIC (IV SOLUTION) ×2 IMPLANT
KIT PROCEDURE FLUENT (KITS) ×2 IMPLANT
KIT TURNOVER CYSTO (KITS) ×2 IMPLANT
MANIFOLD NEPTUNE II (INSTRUMENTS) ×2 IMPLANT
PACK DNC HYST (MISCELLANEOUS) ×2 IMPLANT
PAD DRESSING TELFA 3X8 NADH (GAUZE/BANDAGES/DRESSINGS) IMPLANT
PAD OB MATERNITY 4.3X12.25 (PERSONAL CARE ITEMS) ×2 IMPLANT
PAD PREP 24X41 OB/GYN DISP (PERSONAL CARE ITEMS) ×2 IMPLANT
SCRUB CHG 4% DYNA-HEX 4OZ (MISCELLANEOUS) ×1 IMPLANT
SEAL ROD LENS SCOPE MYOSURE (ABLATOR) ×2 IMPLANT
SET CYSTO W/LG BORE CLAMP LF (SET/KITS/TRAYS/PACK) IMPLANT
SURGILUBE 2OZ TUBE FLIPTOP (MISCELLANEOUS) ×2 IMPLANT
TUBING CONNECTING 10 (TUBING) ×2 IMPLANT

## 2021-09-16 NOTE — Op Note (Addendum)
Operative Note  ? ?Date of Service: 09/16/2021  ?DOB: [birth year]  ?MRN: [id number]  ? ?PRE-OP DIAGNOSIS:  ?1) Menorrhagia with irregular cycle [N92.1] ?2) Endometrial Polyp [N84.0] ?  ?POST-OP DIAGNOSIS:  ?1) Menorrhagia with irregular cycle [N92.1] ?2) Endometrial Polyp [N84.0]  ? ?SURGEON: Surgeon(s) and Role: ?   Will Bonnet, MD - Primary ? ?PROCEDURE: Procedure(s): ?1) Hysteroscopy, dilation and curettage ?2) endometrial polypectomy ? ?ANESTHESIA: General  ? ?ESTIMATED BLOOD LOSS: 50 mL ? ?DRAINS: none  ? ?TOTAL IV FLUIDS: 800 mL ? ?SPECIMENS:  ?Endometrial curetting and endometrial polyp ? ?VTE PROPHYLAXIS: SCDs to the bilateral lower extremities ? ?ANTIBIOTICS: none indicated, none given ? ?FLUID DEFICIT: negligible  ? ?COMPLICATIONS: none ? ?DISPOSITION: PACU - hemodynamically stable. ? ?CONDITION: stable ? ?INDICATION: 21 y.o. female with very heavy menstruation, diagnosed with endometrial polyp on pelvic ultrasound.   ? ?FINDINGS: Exam under anesthesia revealed small, mobile anteverted uterus with no masses and bilateral adnexa without masses or fullness. Hysteroscopy revealed a grossly normal appearing uterine cavity with the exception of a large polypoid lesion stemming from near the left cornu.  The bilateral tubal ostia were normal appearing as well as the endocervical canal. ? ?PROCEDURE IN DETAIL:  After informed consent was obtained, the patient was taken to the operating room where anesthesia was obtained without difficulty. The patient was positioned in the dorsal lithotomy position in [place].  The patient's bladder was catheterized with an in and out foley catheter.  The patient was examined under anesthesia, with the above noted findings.  The bi-valved speculum was placed inside the patient's vagina, and the the anterior lip of the cervix was seen and grasped with the tenaculum.  The cervix was progressively dilated to a 7 mm Hegar dilator.  The hysteroscope was introduced, with the  above noted findings. ?The Myosure Reach device was utilized to remove the polyp in its entirety under direct camera visualization. ? ?The hystersocope was removed and the uterine cavity was curetted until a gritty texture was noted, yielding a medium amount of endometrial curettings.  The hysteroscope was re-introduced and it was verified that the polyp was removed.  Hemostasis was verified by lowering the intrauterine pressure to well below the MAP.   ?The hysteroscope was removed.  There was minimal  bleeding from the cervix.  The tenaculum was removed and silver nitrate was applied to the cervix and hemostasis was obtained at the tenaculum entry sites.   ? ?The vagina was checked to ensure no instruments or sponges remained.  The speculum was removed and a digital sweep of the vagina was done to verify no sponges or instruments remained in the vagina.  ? ?The patient tolerated the procedure well.  Sponge, lap and needle counts were correct x2.  The patient was taken to recovery room in excellent condition. ? ?Will Bonnet, MD, FACOG ?09/16/2021 11:13 AM  ? ?

## 2021-09-16 NOTE — Interval H&P Note (Signed)
History and Physical Interval Note: ? ?09/16/2021 ?9:39 AM ? ?Kim Trevino  has presented today for surgery, with the diagnosis of endometrial polyp, menorrhagia with irregular cycles.  The various methods of treatment have been discussed with the patient and family. After consideration of risks, benefits and other options for treatment, the patient has consented to  Procedure(s): ?DILATATION AND CURETTAGE /HYSTEROSCOPY (N/A) and endometrial polypectomy as a surgical intervention.  The patient's history has been reviewed, patient examined, no change in status, stable for surgery.  I have reviewed the patient's chart and labs.  Questions were answered to the patient's satisfaction.  Consent reviewed and signed.  ? ?Thomasene Mohair, MD, FACOG ?[facility] ?09/16/2021 9:40 AM   ?

## 2021-09-16 NOTE — Anesthesia Preprocedure Evaluation (Addendum)
Anesthesia Evaluation  ?Patient identified by MRN, date of birth, ID band ?Patient awake ? ? ? ?Reviewed: ?Allergy & Precautions, NPO status , Patient's Chart, lab work & pertinent test results ? ?Airway ?Mallampati: II ? ?TM Distance: >3 FB ?Neck ROM: Full ? ? ? Dental ? ?(+) Teeth Intact ?  ?Pulmonary ?neg pulmonary ROS,  ?  ?Pulmonary exam normal ?breath sounds clear to auscultation ? ? ? ? ? ? Cardiovascular ?Exercise Tolerance: Good ?negative cardio ROS ?Normal cardiovascular exam ? ? ?  ?Neuro/Psych ?negative neurological ROS ? negative psych ROS  ? GI/Hepatic ?negative GI ROS, Neg liver ROS,   ?Endo/Other  ?negative endocrine ROS ? Renal/GU ?negative Renal ROS  ?negative genitourinary ?  ?Musculoskeletal ?negative musculoskeletal ROS ?(+)  ? Abdominal ?Normal abdominal exam  (+)   ?Peds ?negative pediatric ROS ?(+)  Hematology ?negative hematology ROS ?(+)   ?Anesthesia Other Findings ?Past Medical History: ?No date: Heart murmur ?    Comment:  as a child ?No date: Migraine with aura ?No date: Pre-diabetes ? ?Past Surgical History: ?No date: MOUTH SURGERY ? ?BMI   ? Body Mass Index: 22.31 kg/m?  ?  ? ? Reproductive/Obstetrics ?negative OB ROS ? ?  ? ? ? ? ? ? ? ? ? ? ? ? ? ?  ?  ? ? ? ? ? ? ? ? ?Anesthesia Physical ?Anesthesia Plan ? ?ASA: 1 ? ?Anesthesia Plan: General  ? ?Post-op Pain Management:   ? ?Induction: Intravenous ? ?PONV Risk Score and Plan: 1 and Ondansetron and Dexamethasone ? ?Airway Management Planned: LMA ? ?Additional Equipment:  ? ?Intra-op Plan:  ? ?Post-operative Plan:  ? ?Informed Consent: I have reviewed the patients History and Physical, chart, labs and discussed the procedure including the risks, benefits and alternatives for the proposed anesthesia with the patient or authorized representative who has indicated his/her understanding and acceptance.  ? ? ? ?Dental Advisory Given ? ?Plan Discussed with: CRNA and Surgeon ? ?Anesthesia Plan Comments:    ? ? ? ? ? ? ?Anesthesia Quick Evaluation ? ?

## 2021-09-16 NOTE — Transfer of Care (Signed)
Immediate Anesthesia Transfer of Care Note ? ?Patient: Kim Trevino ? ?Procedure(s) Performed: DILATATION AND CURETTAGE /HYSTEROSCOPY (Uterus) ? ?Patient Location: PACU ? ?Anesthesia Type:General ? ?Level of Consciousness: drowsy and patient cooperative ? ?Airway & Oxygen Therapy: Patient Spontanous Breathing and Patient connected to face mask oxygen ? ?Post-op Assessment: Report given to RN and Post -op Vital signs reviewed and stable ? ?Post vital signs: Reviewed and stable ? ?Last Vitals:  ?Vitals Value Taken Time  ?BP 102/68 09/16/21 1122  ?Temp 36.2 ?C 09/16/21 1122  ?Pulse 83 09/16/21 1124  ?Resp 13 09/16/21 1124  ?SpO2 100 % 09/16/21 1124  ?Vitals shown include unvalidated device data. ? ?Last Pain:  ?Vitals:  ? 09/16/21 1122  ?TempSrc:   ?PainSc: Asleep  ?   ? ?  ? ?Complications: No notable events documented. ?

## 2021-09-16 NOTE — Discharge Instructions (Signed)

## 2021-09-16 NOTE — Anesthesia Procedure Notes (Signed)
Procedure Name: LMA Insertion ?Date/Time: 09/16/2021 10:14 AM ?Performed by: Tammi Klippel, CRNA ?Pre-anesthesia Checklist: Patient identified, Patient being monitored, Timeout performed, Emergency Drugs available and Suction available ?Patient Re-evaluated:Patient Re-evaluated prior to induction ?Oxygen Delivery Method: Circle system utilized ?Preoxygenation: Pre-oxygenation with 100% oxygen ?Induction Type: IV induction ?Ventilation: Mask ventilation without difficulty ?LMA: LMA inserted ?LMA Size: 4.0 ?Number of attempts: 1 ?Placement Confirmation: positive ETCO2 ?Tube secured with: Tape ?Dental Injury: Teeth and Oropharynx as per pre-operative assessment  ? ? ? ? ?

## 2021-09-17 ENCOUNTER — Encounter: Payer: Self-pay | Admitting: Obstetrics and Gynecology

## 2021-09-20 LAB — SURGICAL PATHOLOGY

## 2021-09-30 NOTE — Anesthesia Postprocedure Evaluation (Signed)
Anesthesia Post Note ? ?Patient: Kim Trevino ? ?Procedure(s) Performed: DILATATION AND CURETTAGE /HYSTEROSCOPY (Uterus) ? ?Patient location during evaluation: PACU ?Anesthesia Type: General ?Level of consciousness: awake and oriented ?Pain management: satisfactory to patient ?Vital Signs Assessment: post-procedure vital signs reviewed and stable ?Respiratory status: spontaneous breathing and respiratory function stable ?Anesthetic complications: no ? ? ?No notable events documented. ? ? ?Last Vitals:  ?Vitals:  ? 09/16/21 1145 09/16/21 1157  ?BP: 113/90 (!) 104/91  ?Pulse:  81  ?Resp:  14  ?Temp: (!) 36.2 ?C (!) 36.1 ?C  ?SpO2:  100%  ?  ?Last Pain:  ?Vitals:  ? 09/17/21 0829  ?TempSrc:   ?PainSc: 0-No pain  ? ? ?  ?  ?  ?  ?  ?  ? ?VAN STAVEREN,Marcelo Ickes ? ? ? ? ?

## 2022-12-13 ENCOUNTER — Inpatient Hospital Stay: Payer: Medicaid Other

## 2022-12-13 ENCOUNTER — Inpatient Hospital Stay: Payer: Medicaid Other | Attending: Oncology | Admitting: Licensed Clinical Social Worker

## 2022-12-13 ENCOUNTER — Encounter: Payer: Self-pay | Admitting: Licensed Clinical Social Worker

## 2022-12-13 DIAGNOSIS — Z8041 Family history of malignant neoplasm of ovary: Secondary | ICD-10-CM

## 2022-12-13 DIAGNOSIS — Z803 Family history of malignant neoplasm of breast: Secondary | ICD-10-CM

## 2022-12-13 DIAGNOSIS — Z800 Family history of malignant neoplasm of digestive organs: Secondary | ICD-10-CM

## 2022-12-13 DIAGNOSIS — Z8481 Family history of carrier of genetic disease: Secondary | ICD-10-CM

## 2022-12-13 NOTE — Progress Notes (Signed)
REFERRING PROVIDER: Self-referred  PRIMARY PROVIDER:  Mickie Bail, MD (Inactive)  PRIMARY REASON FOR VISIT:  1. Family history of breast cancer gene mutation in first degree relative   2. Family history of breast cancer   3. Family history of colon cancer   4. Family history of ovarian cancer      HISTORY OF PRESENT ILLNESS:   Kim Trevino, a 22 y.o. female, was seen for a [facility] [place] cancer genetics consultation due to a family history of cancer and her mother's recent genetic testing that showed an ATM mutation.  Kim Trevino presents to clinic today to discuss the possibility of a hereditary predisposition to cancer, genetic testing, and to further clarify her future cancer risks, as well as potential cancer risks for family members.    CANCER HISTORY:  Kim Trevino is a 22 y.o. female with no personal history of cancer.    RISK FACTORS:  Menarche was at age 65.  Ovaries intact: yes.  Hysterectomy: no.  Menopausal status: premenopausal.  HRT use: 0 years. Colonoscopy: no; n/a Mammogram within the last year: no. Number of breast biopsies: 0.  Past Medical History:  Diagnosis Date   Heart murmur    as a child   Migraine with aura    Pre-diabetes     Past Surgical History:  Procedure Laterality Date   HYSTEROSCOPY WITH D & C N/A 09/16/2021   Procedure: DILATATION AND CURETTAGE /HYSTEROSCOPY;  Surgeon: Conard Novak, MD;  Location: [facility] ORS;  Service: Gynecology;  Laterality: N/A;   MOUTH SURGERY      FAMILY HISTORY:  We obtained a detailed, 4-generation family history.  Significant diagnoses are listed below: Family History  Problem Relation Age of Onset   Breast cancer Mother 44       ATM+   Colon cancer Mother 31   Bladder Cancer Maternal Grandmother 5   Liver cancer Maternal Grandfather 20   Ovarian cancer Paternal Grandmother 49   Kim Trevino has 1 sister and 2 brothers, no cancers.  Kim Trevino mother had colon cancer at 37, breast cancer at 85 and is  living at 10. She recently tested positive for an ATM mutation. Maternal grandmother had bladder cancer and maternal grandfather had liver cancer.   Kim Trevino's father passed at 36. She has limited information about this side of the family. Paternal grandmother died of ovarian cancer at 41.   Kim Trevino is aware of previous family history of genetic testing for hereditary cancer risks. There is no reported Ashkenazi Jewish ancestry. There is no known consanguinity.   GENETIC COUNSELING ASSESSMENT: Kim Trevino is a 22 y.o. female with a maternal family history of an ATM mutation and paternal family history of ovarian cancer which is somewhat suggestive of a hereditary cancer syndrome and predisposition to cancer. We, therefore, discussed and recommended the following at today's visit.   DISCUSSION: We discussed that approximately 10% of  cancer is hereditary. We discussed the ATM gene in detail noting cancer risks and potential management changes. There are other genes associated with hereditary  cancer as well. Cancers and risks are gene specific. We discussed that testing is beneficial for several reasons including knowing about cancer risks, identifying potential screening and risk-reduction options that may be appropriate, and to understand if other family members could be at risk for cancer and allow them to undergo genetic testing.   We reviewed the characteristics, features and inheritance patterns of hereditary cancer syndromes. We also discussed genetic testing, including  the appropriate family members to test, the process of testing, insurance coverage and turn-around-time for results. We discussed the implications of a negative, positive and/or variant of uncertain significant result. We recommended Kim Trevino pursue genetic testing for the [facility] Multi-Cancer+RNA gene panel due to her paternal family history. The lab has also recommended ordering the full panel with RNA for relatives for the  specific gene mutation found in Kim Trevino mother as it is currently out of range.  Based on Kim Trevino family history of cancer, she meets medical criteria for genetic testing. Despite that she meets criteria, she may still have an out of pocket cost.   PLAN: After considering the risks, benefits, and limitations, Kim Trevino provided informed consent to pursue genetic testing and the blood sample was sent to [facility] for analysis of the Multi-Cancer+RNA panel. Results should be available within approximately 2-3 weeks' time, at which point they will be disclosed by telephone to Kim Trevino, as will any additional recommendations warranted by these results. Kim Trevino will receive a summary of her genetic counseling visit and a copy of her results once available. This information will also be available in Epic.   Kim Trevino questions were answered to her satisfaction today. Our contact information was provided should additional questions or concerns arise. Thank you for the referral and allowing [place] to share in the care of your patient.   Lacy Duverney, MS, [facility] Genetic Counselor [email or web address] Phone: [phone]  The patient was seen for a total of 30 minutes in face-to-face genetic counseling.  Kim Trevino mother was also present. Dr. Orlie Dakin was available for discussion regarding this case.   _______________________________________________________________________ For Office Staff:  Number of people involved in session: 2 Was an Intern/ student involved with case: no

## 2023-01-04 ENCOUNTER — Telehealth: Payer: Self-pay | Admitting: Licensed Clinical Social Worker

## 2023-01-04 ENCOUNTER — Encounter: Payer: Self-pay | Admitting: Licensed Clinical Social Worker

## 2023-01-04 ENCOUNTER — Ambulatory Visit: Payer: Self-pay | Admitting: Licensed Clinical Social Worker

## 2023-01-04 DIAGNOSIS — Z1379 Encounter for other screening for genetic and chromosomal anomalies: Secondary | ICD-10-CM

## 2023-01-04 DIAGNOSIS — Z1509 Genetic susceptibility to other malignant neoplasm: Secondary | ICD-10-CM

## 2023-01-04 NOTE — Progress Notes (Signed)
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 Denker was previously seen in the [facility] due to a family history of an ATM mutation and concerns regarding a hereditary predisposition to cancer. Please refer to our prior cancer genetics clinic note for more information regarding our discussion, assessment and recommendations, at the time. Ms. Fratus recent genetic test results were disclosed to her, as were recommendations warranted by these results. These results and recommendations are discussed in more detail below.  CANCER HISTORY:  Oncology History   No history exists.    FAMILY HISTORY:  We obtained a detailed, 4-generation family history.  Significant diagnoses are listed below: Family History  Problem Relation Age of Onset   Breast cancer Mother 14       ATM+   Colon cancer Mother 10   Bladder Cancer Maternal Grandmother 73   Liver cancer Maternal Grandfather 33   Ovarian cancer Paternal Grandmother 32    Ms. Hagman has 1 sister and 2 brothers, no cancers.   Ms. Steve mother had colon cancer at 61, breast cancer at 55 and is living at 83. She recently tested positive for an ATM mutation. Maternal grandmother had bladder cancer and maternal grandfather had liver cancer.    Ms. Eagleson's father passed at 40. She has limited information about this side of the family. Paternal grandmother died of ovarian cancer at 9.    Ms. Cabriales is aware of previous family history of genetic testing for hereditary cancer risks. There is no reported Ashkenazi Jewish ancestry. There is no known consanguinity.     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 The remainder of testing was negative/normal.  The test report has been scanned into EPIC and is located under the Molecular Pathology section of the Results Review tab.  A portion of the  result report is included below for reference. Genetic testing reported out on 12/31/2022.     Cancer Risks for ATM: Women have a 20-40% lifetime risk of breast cancer. 2-3% risk for epithelial ovarian cancer 5-10% risk for pancreatic cancer  There is emerging evidence suggesting an increased risk for prostate cancer.  Research is ongoing about the cancers associated with ATM pathogenic variants and risks are to develop these cancers.  Management Recommendations:  Breast Screening/Risk Reduction: Breast cancer screening includes: Breast awareness beginning at age 64 Monthly self-breast examination beginning at age 76 Clinical breast examination every 6-12 months beginning at age 43 or at the age of the earliest diagnosed breast cancer in the family, if onset was before age 13 Annual mammogram with consideration of tomosynthesis starting at age 70 or 10 years prior to the youngest age of diagnosis, whichever comes first Consider breast MRI with and without contrast starting at age 53-35 Consider additional risk reducing strategies such as Tamoxifen Evidence is insufficient for a prophylactic risk-reducing mastectomy, manage based on family history  For patients who are treated for breast cancer and have not had bilateral mastectomy, screening should continue as described  Ovarian Cancer Screening/Risk Reduction: Evidence insufficient for risk-reducing salpingo oophorectomy; manage based on family history If there is a family history of ovarian cancer, have a discussion with your physician about the benefits and limitations of screening and risk reducing strategies  Pancreatic Cancer Screening/Risk Reduction: Avoid smoking, heavy alcohol use, and obesity. Pancreatic cancer screening may be considered in those with a family  history of pancreatic cancer (first- or second-degree relative). Screening includes annual endoscopic ultrasound (preferred) and/or MRI of the pancreas starting at age  36 or 87 years younger than the earliest age diagnosis in the family.  Annual concurrent CA19-9 testing may also be considered.  Prostate Cancer Screening: Consider beginning annual PSA blood test and digital rectal exams at age 54.  Additional considerations: There is insufficient evidence to recommend against radiation therapy.  Individuals with a single pathogenic ATM variant are also carriers of ataxia telangiectasia. Ataxia telangiectasia is associated with childhood cancer risks as well as other medical problems (such as difficulty with movement, balance and coordination problems, neuropathy, and weakened immunity). For there to be a risk of ataxia telangiectasia in offspring, both the patient and their partner would each have to carry a pathogenic variant in ATM; in this case, the risk to have an affected child is 25%.  This information is based on current understanding of the gene and may change in the future.  Implications for Family Members: Hereditary predisposition to cancer due to pathogenic variants in the ATM gene has autosomal dominant inheritance. This means that an individual with a pathogenic variant has a 50% chance of passing the condition on to his/her offspring. Identification of a pathogenic variant allows for the recognition of at-risk relatives who can pursue testing for the familial variant.   Family members are encouraged to consider genetic testing for this familial pathogenic variant. As there are generally no childhood cancer risks associated with pathogenic variants in the ATM gene, individuals in the family are not recommended to have testing until they reach at least 23 years of age. They may contact our office at [phone] for more information or to schedule an appointment. Complimentary testing for the familial variant is available for 90 days. Family members who live outside of the area are encouraged to find a genetic counselor in their area by visiting:  [email or web address].  Resources: [employer]CE (Facing Our Risk of Cancer Empowered) is a resource for those with a hereditary predisposition to develop cancer.  [employer] provides information about risk reduction, advocacy, legislation, and clinical trials.  Additionally, [employer] provides a platform for collaboration and support; which includes: peer navigation, message boards, local support groups, a toll-free helpline, research registry and recruitment, advocate training, published medical research, webinars, brochures, mastectomy photos, and more.  For more information, visit www.facingourrisk.org  PLAN: 1. These results will be made available to  her PCP, Carren Rang, PA . She would like her to follow her long-term for this indication and coordinate screening/prophylactic surgeries. She can also consider referral to high risk breast clinic here at the cancer center once she turns 29 to consider initiating breast MRIs.    2. Ms. Stoffers plans to discuss these results with her family and will reach out to [place] if we can be of any assistance in coordinating genetic testing for any of her relatives.   We encouraged Ms. Timko to remain in contact with [place] on an annual basis so we can update her personal and family histories, and let her know of advances in cancer genetics that may benefit the family. Our contact number was provided. Ms. Fugate questions were answered to her satisfaction today, and she knows she is welcome to call anytime with additional questions.   Lacy Duverney, MS, [facility] Genetic Counselor [email or web address] Phone: [phone]

## 2023-01-04 NOTE — Telephone Encounter (Signed)
I contacted Kim Trevino to discuss her genetic testing results. 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 Detailed clinic note to follow.   The test report has been scanned into EPIC and is located under the Molecular Pathology section of the Results Review tab.  A portion of the result report is included below for reference.      Kim Duverney, MS, [facility] Genetic Counselor [email or web address] Phone: [phone]

## 2023-08-23 ENCOUNTER — Encounter: Payer: Self-pay | Admitting: Genetic Counselor

## 2024-08-14 ENCOUNTER — Ambulatory Visit
